# Patient Record
Sex: Female | Born: 1995 | Race: Black or African American | Hispanic: No | Marital: Single | Smoking: Never smoker
Health system: Southern US, Community
[De-identification: ages and names within clinical notes are randomized; demographics above are authoritative.]

## PROBLEM LIST (undated history)

## (undated) DIAGNOSIS — J45909 Unspecified asthma, uncomplicated: Secondary | ICD-10-CM

---

## 2016-06-30 ENCOUNTER — Emergency Department: Payer: BLUE CROSS/BLUE SHIELD

## 2016-06-30 ENCOUNTER — Encounter: Payer: Self-pay | Admitting: Emergency Medicine

## 2016-06-30 ENCOUNTER — Emergency Department
Admission: EM | Admit: 2016-06-30 | Discharge: 2016-06-30 | Disposition: A | Discharge status: Home / Self Care | Payer: BLUE CROSS/BLUE SHIELD | Attending: Emergency Medicine | Admitting: Emergency Medicine

## 2016-06-30 DIAGNOSIS — J45909 Unspecified asthma, uncomplicated: Secondary | ICD-10-CM | POA: Diagnosis not present

## 2016-06-30 DIAGNOSIS — R0789 Other chest pain: Secondary | ICD-10-CM | POA: Diagnosis not present

## 2016-06-30 DIAGNOSIS — R079 Chest pain, unspecified: Secondary | ICD-10-CM | POA: Diagnosis present

## 2016-06-30 HISTORY — DX: Unspecified asthma, uncomplicated: J45.909

## 2016-06-30 LAB — CBC WITH DIFFERENTIAL/PLATELET
Basophils Absolute: 0.1 10*3/uL (ref 0–0.1)
Basophils Relative: 0 %
EOS PCT: 2 %
Eosinophils Absolute: 0.2 10*3/uL (ref 0–0.7)
HCT: 34.6 % — ABNORMAL LOW (ref 35.0–47.0)
Hemoglobin: 11.4 g/dL — ABNORMAL LOW (ref 12.0–16.0)
LYMPHS ABS: 3 10*3/uL (ref 1.0–3.6)
LYMPHS PCT: 22 %
MCH: 23.5 pg — AB (ref 26.0–34.0)
MCHC: 32.8 g/dL (ref 32.0–36.0)
MCV: 71.5 fL — AB (ref 80.0–100.0)
MONOS PCT: 8 %
Monocytes Absolute: 1 10*3/uL — ABNORMAL HIGH (ref 0.2–0.9)
Neutro Abs: 9.6 10*3/uL — ABNORMAL HIGH (ref 1.4–6.5)
Neutrophils Relative %: 68 %
PLATELETS: 501 10*3/uL — AB (ref 150–440)
RBC: 4.84 MIL/uL (ref 3.80–5.20)
RDW: 14.7 % — ABNORMAL HIGH (ref 11.5–14.5)
WBC: 14 10*3/uL — AB (ref 3.6–11.0)

## 2016-06-30 LAB — BASIC METABOLIC PANEL
Anion gap: 9 (ref 5–15)
BUN: 8 mg/dL (ref 6–20)
CHLORIDE: 103 mmol/L (ref 101–111)
CO2: 27 mmol/L (ref 22–32)
CREATININE: 0.71 mg/dL (ref 0.44–1.00)
Calcium: 9.4 mg/dL (ref 8.9–10.3)
GFR calc Af Amer: >60 mL/min (ref >60)
GLUCOSE: 98 mg/dL (ref 65–99)
POTASSIUM: 3.8 mmol/L (ref 3.5–5.1)
Sodium: 139 mmol/L (ref 135–145)

## 2016-06-30 LAB — TROPONIN I: Troponin I: <0.03 ng/mL (ref <0.03)

## 2016-06-30 NOTE — ED Notes (Signed)
Pt returns to room 

## 2016-06-30 NOTE — ED Provider Notes (Signed)
[facility] Emergency Department Provider Note  ____________________________________________   First MD Initiated Contact with Patient 06/30/16 [phone]     (approximate)  I have reviewed the triage vital signs and the nursing notes.   HISTORY  Chief Complaint Chest Pain    HPI Selena Moore is a 21 y.o. female who comes to the emergency department with sharp aching upper chest discomfort that woke her from her sleep 1 hour prior to arrival.The pain radiates into her back and is worse when taking a deep breath. Nothing in particular makes it better. She denies cocaine or methamphetamine use. She denies family history of sudden cardiac death or early myocardial infarction. She denies recent travel or immobilization. She's never had a DVT or pulmonary embolism. She is not a smoker. She does not use OCPs   Past Medical History:  Diagnosis Date  . Asthma     There are no active problems to display for this patient.   History reviewed. No pertinent surgical history.  Prior to Admission medications   Not on File    Allergies Patient has no known allergies.  No family history on file.  Social History Social History  Substance Use Topics  . Smoking status: Never Smoker  . Smokeless tobacco: Never Used  . Alcohol use Yes    Review of Systems Constitutional: No fever/chills Eyes: No visual changes. ENT: No sore throat. Cardiovascular: Positive chest pain. Respiratory: Positive shortness of breath. Gastrointestinal: No abdominal pain.  No nausea, no vomiting.  No diarrhea.  No constipation. Genitourinary: Negative for dysuria. Musculoskeletal: Negative for back pain. Skin: Negative for rash. Neurological: Negative for headaches, focal weakness or numbness.  10-point ROS otherwise negative.  ____________________________________________   PHYSICAL EXAM:  VITAL SIGNS: ED Triage Vitals  Enc Vitals Group     BP 06/30/16 0424  136/86     Pulse Rate 06/30/16 0424 89     Resp 06/30/16 0424 18     Temp 06/30/16 0424 98.5 F (36.9 C)     Temp Source 06/30/16 0424 Oral     SpO2 06/30/16 0424 100 %     Weight 06/30/16 0425 130 lb (59 kg)     Height 06/30/16 0425 5' (1.524 m)     Head Circumference --      Peak Flow --      Pain Score 06/30/16 0424 8     Pain Loc --      Pain Edu? --      Excl. in GC? --     Constitutional: Alert and oriented x 4 well appearing nontoxic no diaphoresis speaks in full, clear sentences Eyes: PERRL EOMI. Head: Atraumatic. Nose: No congestion/rhinnorhea. Mouth/Throat: No trismus Neck: No stridor.   Cardiovascular: Normal rate, regular rhythm. Grossly normal heart sounds.  Good peripheral circulation. Respiratory: Normal respiratory effort.  No retractions. Lungs CTAB and moving good air Gastrointestinal: Soft nontender Musculoskeletal: No lower extremity edema   Neurologic:  Normal speech and language. No gross focal neurologic deficits are appreciated. Skin:  Skin is warm, dry and intact. No rash noted. Psychiatric: Mood and affect are normal. Speech and behavior are normal.    ____________________________________________   DIFFERENTIAL  Pulmonary malaise and, acute coronary syndrome, pneumothorax, pleurisy ____________________________________________   LABS (all labs ordered are listed, but only abnormal results are displayed)  Labs Reviewed  CBC WITH DIFFERENTIAL/PLATELET - Abnormal; Notable for the following:       Result Value   WBC 14.0 (*)  Hemoglobin 11.4 (*)    HCT 34.6 (*)    MCV 71.5 (*)    MCH 23.5 (*)    RDW 14.7 (*)    Platelets 501 (*)    Neutro Abs 9.6 (*)    Monocytes Absolute 1.0 (*)    All other components within normal limits  BASIC METABOLIC PANEL  TROPONIN I    Elevated white count of clear clinical significance but no signs of acute ischemia __________________________________________  EKG  ED ECG REPORT I, Merrily BrittleNeil Conal Shetley, the  attending physician, personally viewed and interpreted this ECG.  Date: 06/30/2016 Rate: 86 Rhythm: normal sinus rhythm QRS Axis: normal Intervals: normal ST/T Wave abnormalities: normal Conduction Disturbances: none Narrative Interpretation: unremarkable  ____________________________________________  RADIOLOGY  No signs of acute ischemia ____________________________________________   PROCEDURES  Procedure(s) performed: no  Procedures  Critical Care performed: no  Observation: no ____________________________________________   INITIAL IMPRESSION / ASSESSMENT AND PLAN / ED COURSE  Pertinent labs & imaging results that were available during my care of the patient were reviewed by me and considered in my medical decision making (see chart for details).  The patient arrives very well-appearing with normal vital signs and unremarkable EKG normal chest x-ray. Doubt acute coronary syndrome. She has PERC negative. Unclear etiology of the patient's chest pain but it is likely benign in etiology. Reassurance given him strict return precautions. I will refer her to a primary care physician as she does not currently have one.      ____________________________________________   FINAL CLINICAL IMPRESSION(S) / ED DIAGNOSES  Final diagnoses:  Atypical chest pain      NEW MEDICATIONS STARTED DURING THIS VISIT:  There are no discharge medications for this patient.    Note:  This document was prepared using Dragon voice recognition software and may include unintentional dictation errors.     Merrily Brittleifenbark, Chyrl Elwell, MD 07/01/16 1323

## 2016-06-30 NOTE — Discharge Instructions (Signed)
Please make an appointment to follow-up with a primary care physician. Return to the emergency department for any new or worsening symptoms such as worsening chest pain, shortness of breath, or for any other concerns.  It was a pleasure to take care of you today, and thank you for coming to our emergency department.  If you have any questions or concerns before leaving please ask the nurse to grab me and I'm more than happy to go through your aftercare instructions again.  If you were prescribed any opioid pain medication today such as Norco, Vicodin, Percocet, morphine, hydrocodone, or oxycodone please make sure you do not drive when you are taking this medication as it can alter your ability to drive safely.  If you have any concerns once you are home that you are not improving or are in fact getting worse before you can make it to your follow-up appointment, please do not hesitate to call 911 and come back for further evaluation.  Merrily BrittleNeil Meet Weathington MD  Results for orders placed or performed during the hospital encounter of 06/30/16  CBC with Differential  Result Value Ref Range   WBC 14.0 (H) 3.6 - 11.0 K/uL   RBC 4.84 3.80 - 5.20 MIL/uL   Hemoglobin 11.4 (L) 12.0 - 16.0 g/dL   HCT 16.134.6 (L) 09.635.0 - 04.547.0 %   MCV 71.5 (L) 80.0 - 100.0 fL   MCH 23.5 (L) 26.0 - 34.0 pg   MCHC 32.8 32.0 - 36.0 g/dL   RDW 40.914.7 (H) 81.111.5 - 91.414.5 %   Platelets 501 (H) 150 - 440 K/uL   Neutrophils Relative % 68 %   Neutro Abs 9.6 (H) 1.4 - 6.5 K/uL   Lymphocytes Relative 22 %   Lymphs Abs 3.0 1.0 - 3.6 K/uL   Monocytes Relative 8 %   Monocytes Absolute 1.0 (H) 0.2 - 0.9 K/uL   Eosinophils Relative 2 %   Eosinophils Absolute 0.2 0 - 0.7 K/uL   Basophils Relative 0 %   Basophils Absolute 0.1 0 - 0.1 K/uL  Basic metabolic panel  Result Value Ref Range   Sodium 139 135 - 145 mmol/L   Potassium 3.8 3.5 - 5.1 mmol/L   Chloride 103 101 - 111 mmol/L   CO2 27 22 - 32 mmol/L   Glucose, Bld 98 65 - 99 mg/dL   BUN 8 6  - 20 mg/dL   Creatinine, Ser 7.820.71 0.44 - 1.00 mg/dL   Calcium 9.4 8.9 - 95.610.3 mg/dL   GFR calc non Af Amer >60 >60 mL/min   GFR calc Af Amer >60 >60 mL/min   Anion gap 9 5 - 15  Troponin I  Result Value Ref Range   Troponin I <0.03 <0.03 ng/mL   Dg Chest 2 View  Result Date: 06/30/2016 CLINICAL DATA:  Left-sided chest pain radiating to the left arm this morning. History of exercise induced asthma. Nonsmoker. EXAM: CHEST  2 VIEW COMPARISON:  None in PACs FINDINGS: The lungs are well-expanded and clear. There is no pneumothorax or pneumomediastinum or pleural effusion. The heart and pulmonary vascularity are normal. The somewhat unusual contour of the aortic arch. The arch does appear to be left-sided. The bony thorax exhibits no acute abnormality. IMPRESSION: No evidence of CHF or pneumonia. Straightening of the normal rounding of the aortic arch on the frontal view. No definite acute abnormality on the lateral view. Given the lack of any previous studies it is difficult to judge if this is an acute or chronic  process, acquired or congenital. Further evaluation with chest CT scanning would be useful to better evaluate mediastinal anatomy. Electronically Signed   By: David  [place] M.D.   On: 06/30/2016 07:07

## 2016-06-30 NOTE — ED Notes (Signed)
Pt alert and oriented, NAD. Skin warm dry and pink. Respirations unlabored.

## 2016-06-30 NOTE — ED Notes (Signed)
Pt uprite on stretcher in exam room with no distress noted; pt reports awoke with left upper chest pain radiating into back that increased with deep breathing; denies hx of same, st "thought it was just how I was sleeping"; resp even/unlab, lungs clear, apical audible & regular, +BS, abd soft/nondist, +PP, -edema; st pain currently 3/10, took 2 advil at 3am with onset

## 2016-06-30 NOTE — ED Triage Notes (Signed)
Pt presents to ED with c/o mid chest pain. Pt states she was lying down to go to sleep around 0330 at the onset of her pain. Pain is described as sharp in nature and radiates to her left scapula . States she is feeling slightly sob at this time. Denies nausea or vomiting. Pt currently has no increased work of breathing noted. Pain increases when taking a deep breath. Denies birth control use or recent traveling long distance.

## 2016-06-30 NOTE — ED Notes (Signed)
Pt to xray via stretcher accomp by radiology tech 

## 2017-11-16 IMAGING — CR DG CHEST 2V
2 series · 2 of 2 positions shown · non-contrast
Comparison: None in PACs

CLINICAL DATA: Left-sided chest pain radiating to the left arm this
morning. History of exercise induced asthma. Nonsmoker.

EXAM:
CHEST  2 VIEW

[chest pa]
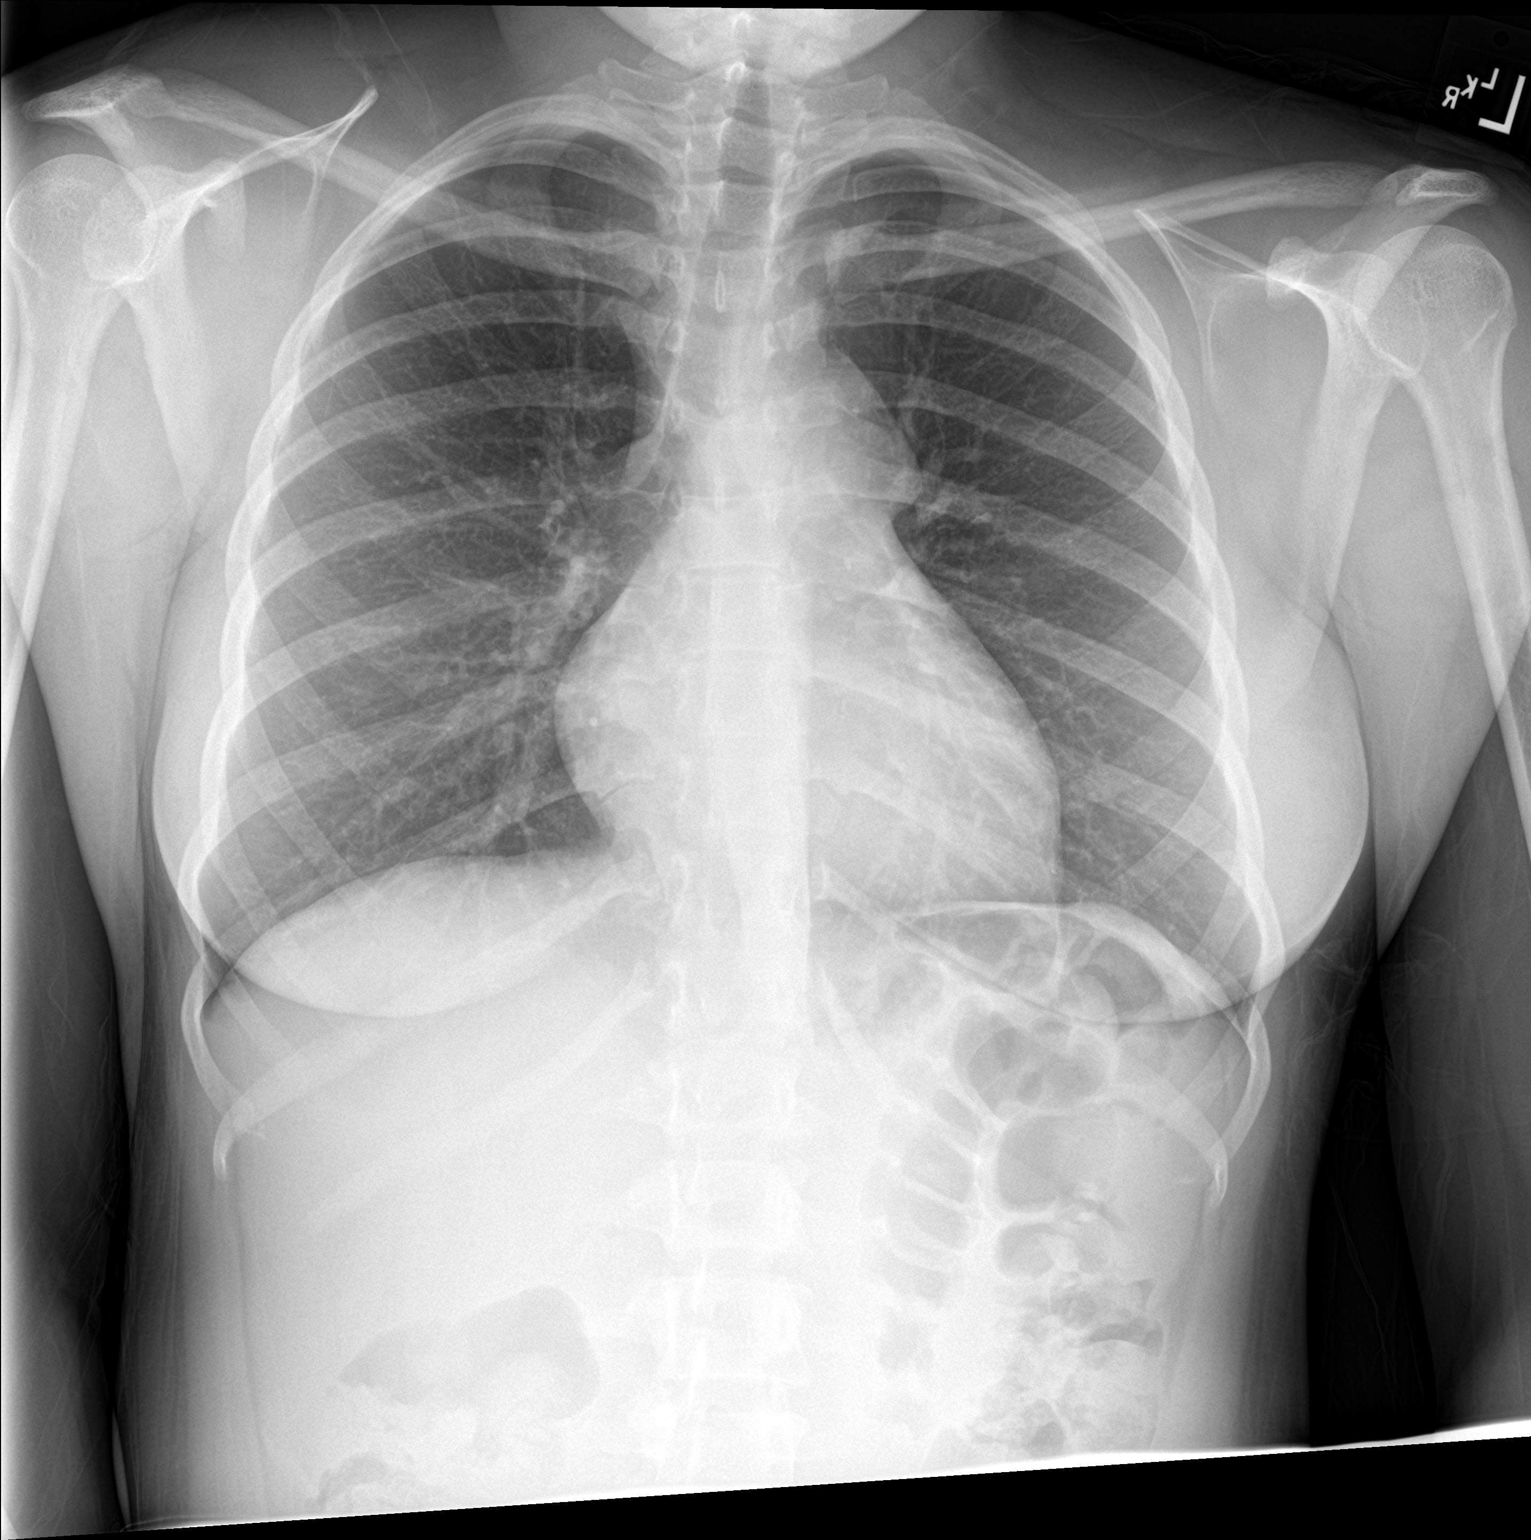

[chest lat]
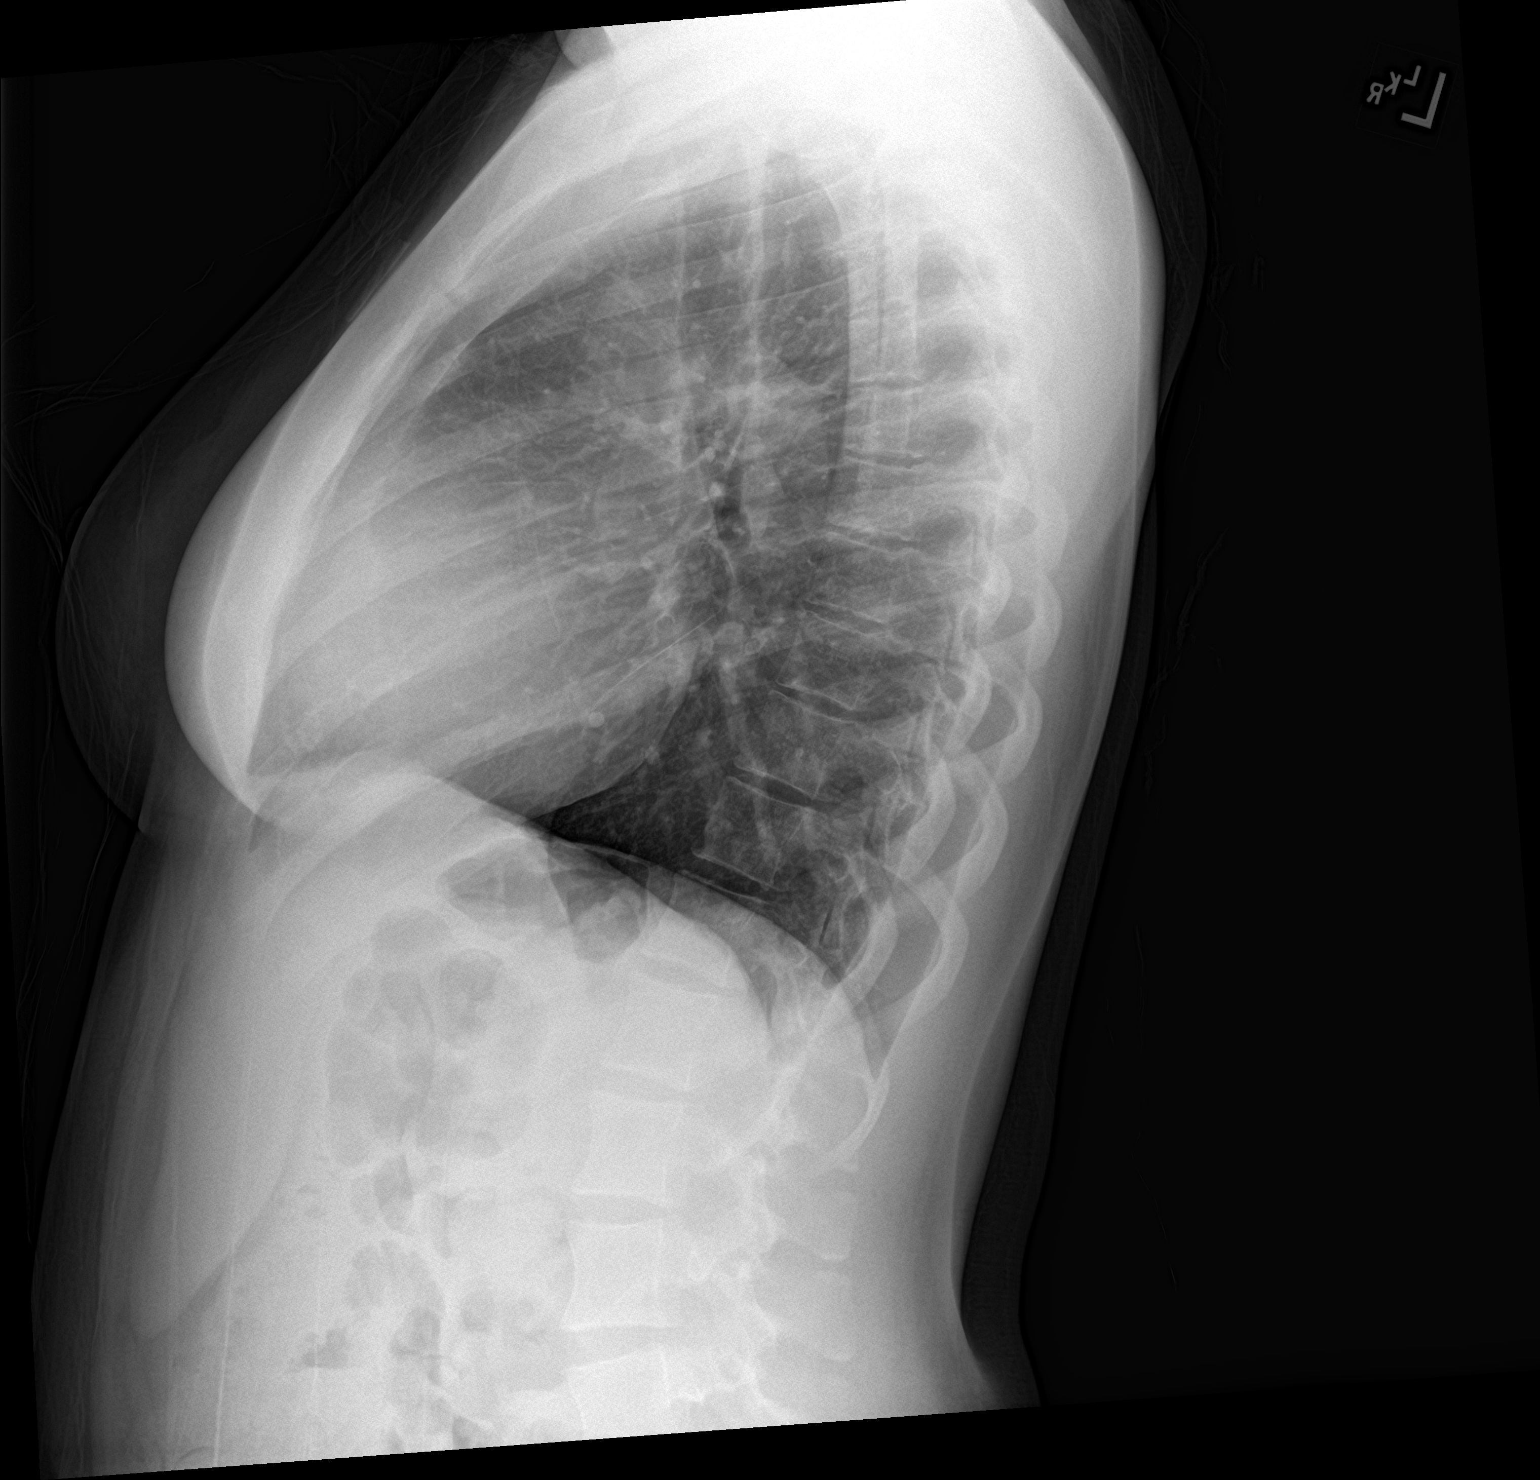

[2 of 2 positions shown; findings below may reference images not displayed]

FINDINGS: The lungs are well-expanded and clear. There is no pneumothorax or
pneumomediastinum or pleural effusion. The heart and pulmonary
vascularity are normal. The somewhat unusual contour of the aortic
arch. The arch does appear to be left-sided. The bony thorax
exhibits no acute abnormality.
IMPRESSION: No evidence of CHF or pneumonia. Straightening of the normal
rounding of the aortic arch on the frontal view. No definite acute
abnormality on the lateral view. Given the lack of any previous
studies it is difficult to judge if this is an acute or chronic
process, acquired or congenital. Further evaluation with chest CT
scanning would be useful to better evaluate mediastinal anatomy.
